# Patient Record
Sex: Female | Born: 1945 | Hispanic: Yes | Marital: Married | Smoking: Never smoker
Health system: Southern US, Community
[De-identification: ages and names within clinical notes are randomized; demographics above are authoritative.]

## PROBLEM LIST (undated history)

## (undated) DIAGNOSIS — I10 Essential (primary) hypertension: Secondary | ICD-10-CM

## (undated) DIAGNOSIS — H269 Unspecified cataract: Secondary | ICD-10-CM

## (undated) DIAGNOSIS — E785 Hyperlipidemia, unspecified: Secondary | ICD-10-CM

## (undated) HISTORY — DX: Hyperlipidemia, unspecified: E78.5

## (undated) HISTORY — DX: Essential (primary) hypertension: I10

## (undated) HISTORY — PX: EYE SURGERY: SHX253

## (undated) HISTORY — DX: Unspecified cataract: H26.9

---

## 2008-02-17 ENCOUNTER — Ambulatory Visit (HOSPITAL_COMMUNITY): Admission: RE | Admit: 2008-02-17 | Discharge: 2008-02-17 | Payer: Self-pay | Admitting: Family Medicine

## 2010-04-24 ENCOUNTER — Ambulatory Visit (HOSPITAL_COMMUNITY): Admission: RE | Admit: 2010-04-24 | Discharge: 2010-04-24 | Payer: Self-pay | Admitting: Family Medicine

## 2010-11-19 ENCOUNTER — Encounter: Payer: Self-pay | Admitting: Family Medicine

## 2010-11-20 ENCOUNTER — Encounter: Payer: Self-pay | Admitting: Family Medicine

## 2013-07-21 ENCOUNTER — Other Ambulatory Visit (HOSPITAL_COMMUNITY): Payer: Self-pay | Admitting: Nurse Practitioner

## 2013-07-21 DIAGNOSIS — Z139 Encounter for screening, unspecified: Secondary | ICD-10-CM

## 2013-07-28 ENCOUNTER — Ambulatory Visit (HOSPITAL_COMMUNITY)
Admission: RE | Admit: 2013-07-28 | Discharge: 2013-07-28 | Disposition: A | Discharge status: Home / Self Care | Payer: PRIVATE HEALTH INSURANCE | Source: Ambulatory Visit | Attending: Nurse Practitioner | Admitting: Nurse Practitioner

## 2013-07-28 DIAGNOSIS — Z139 Encounter for screening, unspecified: Secondary | ICD-10-CM

## 2013-08-03 ENCOUNTER — Other Ambulatory Visit: Payer: Self-pay | Admitting: Nurse Practitioner

## 2013-08-03 DIAGNOSIS — R928 Other abnormal and inconclusive findings on diagnostic imaging of breast: Secondary | ICD-10-CM

## 2013-09-23 ENCOUNTER — Ambulatory Visit (HOSPITAL_COMMUNITY)
Admission: RE | Admit: 2013-09-23 | Discharge: 2013-09-23 | Disposition: A | Discharge status: Home / Self Care | Payer: PRIVATE HEALTH INSURANCE | Source: Ambulatory Visit | Attending: Nurse Practitioner | Admitting: Nurse Practitioner

## 2013-09-23 ENCOUNTER — Other Ambulatory Visit: Payer: Self-pay | Admitting: Nurse Practitioner

## 2013-09-23 DIAGNOSIS — R928 Other abnormal and inconclusive findings on diagnostic imaging of breast: Secondary | ICD-10-CM | POA: Insufficient documentation

## 2013-09-23 DIAGNOSIS — N63 Unspecified lump in unspecified breast: Secondary | ICD-10-CM | POA: Insufficient documentation

## 2014-03-05 ENCOUNTER — Ambulatory Visit: Payer: Self-pay | Admitting: Family Medicine

## 2014-03-19 ENCOUNTER — Ambulatory Visit (INDEPENDENT_AMBULATORY_CARE_PROVIDER_SITE_OTHER): Payer: Self-pay | Admitting: Family Medicine

## 2014-03-19 ENCOUNTER — Encounter: Payer: Self-pay | Admitting: Family Medicine

## 2014-03-19 VITALS — BP 134/67 | HR 95 | Temp 98.1°F | Wt 187.1 lb

## 2014-03-19 DIAGNOSIS — I10 Essential (primary) hypertension: Secondary | ICD-10-CM | POA: Insufficient documentation

## 2014-03-19 DIAGNOSIS — K089 Disorder of teeth and supporting structures, unspecified: Secondary | ICD-10-CM

## 2014-03-19 DIAGNOSIS — H269 Unspecified cataract: Secondary | ICD-10-CM

## 2014-03-19 DIAGNOSIS — M549 Dorsalgia, unspecified: Secondary | ICD-10-CM

## 2014-03-19 DIAGNOSIS — E785 Hyperlipidemia, unspecified: Secondary | ICD-10-CM

## 2014-03-19 MED ORDER — LISINOPRIL 10 MG PO TABS: 10.0000 mg | ORAL_TABLET | Freq: Every day | ORAL | Status: AC

## 2014-03-19 MED ORDER — FENOFIBRATE 160 MG PO TABS: 160.0000 mg | ORAL_TABLET | Freq: Every day | ORAL | Status: AC

## 2014-03-19 NOTE — Assessment & Plan Note (Signed)
Well controlled on lisinopril 10mg  daily - refilled today - check bmet at next visit

## 2014-03-19 NOTE — Assessment & Plan Note (Addendum)
Patient has multiple missing teeth and those that remain appear infected, she also reports dental pain - Norma (SW) met with patient to offer options for low cost services to have her teeth examined/removed

## 2014-03-19 NOTE — Progress Notes (Signed)
   Subjective:    Patient ID: Marie Mccoy, female    DOB: 10/23/1946, 68 y.o.   MRN: [id number]  HPI Patient is a 68 year old woman with past history of hypertension and hyperlipidemia who presents to establish care. She also had bilateral cataracts which were removed in 2014 and she reports that now she sees very well. She denies any other medical or surgical history. She is a never smoker and denies alcohol and illicit drug use. She takes lisinopril and fenofibrate and no other medications.    Review of Systems  Constitutional: Negative for fever and unexpected weight change.  HENT: Positive for dental problem.   Respiratory: Negative for cough and shortness of breath.   Cardiovascular: Negative for chest pain and leg swelling.  Gastrointestinal: Negative for nausea, vomiting, abdominal pain and diarrhea.  Musculoskeletal: Positive for back pain.  Neurological: Negative for dizziness.  All other systems reviewed and are negative.      Objective:   Physical Exam  Nursing note and vitals reviewed. Constitutional: She is oriented to person, place, and time. She appears well-developed and well-nourished. No distress.  HENT:  Head: Normocephalic and atraumatic.  Very poor dentition with most teeth missing. Few remaining teeth are tender and appear necrotic  Eyes: Conjunctivae are normal. Right eye exhibits no discharge. Left eye exhibits no discharge. No scleral icterus.  Cardiovascular: Normal rate, regular rhythm, normal heart sounds and intact distal pulses.   No murmur heard. Pulmonary/Chest: Effort normal and breath sounds normal. No respiratory distress. She has no wheezes.  Abdominal: Soft. Bowel sounds are normal. She exhibits no distension and no mass. There is no tenderness. There is no rebound and no guarding.  Musculoskeletal: Normal range of motion. She exhibits no edema and no tenderness.  Neurological: She is alert and oriented to person, place, and time.  Skin:  Skin is warm and dry. No rash noted. She is not diaphoretic.  Psychiatric: She has a normal mood and affect. Her behavior is normal.          Assessment & Plan:

## 2014-03-19 NOTE — Patient Instructions (Signed)
Gracias por venir hoy.  Estoy feliz de tener Usted y su esposo como pacientes.  Si esta enferma, por favor, llama a este numero para hacer una cita conmigo o con uno de mis companeros.  [phone]

## 2014-03-19 NOTE — Assessment & Plan Note (Signed)
Reports previously had severe upper back pain and was given naproxen. It is now better.

## 2014-03-19 NOTE — Assessment & Plan Note (Signed)
Patient reports history of hyperlipidemia, well controlled on fenofibrate - check lipid panel at next visit to assess risk

## 2014-03-26 ENCOUNTER — Other Ambulatory Visit (HOSPITAL_COMMUNITY): Payer: Self-pay | Admitting: *Deleted

## 2014-03-26 DIAGNOSIS — R928 Other abnormal and inconclusive findings on diagnostic imaging of breast: Secondary | ICD-10-CM

## 2014-04-05 ENCOUNTER — Emergency Department (HOSPITAL_COMMUNITY)
Admission: EM | Admit: 2014-04-05 | Discharge: 2014-04-05 | Disposition: A | Discharge status: Home / Self Care | Payer: Self-pay

## 2014-04-05 ENCOUNTER — Encounter (HOSPITAL_COMMUNITY): Payer: Self-pay | Admitting: Emergency Medicine

## 2014-04-05 NOTE — ED Notes (Signed)
Pt is spanish speaking, reports was sent here from [facility] for a pelvic exam. Does not know why, denies having any problems or symptoms. Reports was just sent here for a pap smear.

## 2014-04-07 ENCOUNTER — Ambulatory Visit (HOSPITAL_COMMUNITY)
Admission: RE | Admit: 2014-04-07 | Discharge: 2014-04-07 | Disposition: A | Discharge status: Home / Self Care | Payer: PRIVATE HEALTH INSURANCE | Source: Ambulatory Visit | Attending: *Deleted | Admitting: *Deleted

## 2014-04-07 DIAGNOSIS — Z0389 Encounter for observation for other suspected diseases and conditions ruled out: Secondary | ICD-10-CM | POA: Insufficient documentation

## 2014-04-07 DIAGNOSIS — R928 Other abnormal and inconclusive findings on diagnostic imaging of breast: Secondary | ICD-10-CM

## 2015-02-28 ENCOUNTER — Ambulatory Visit (INDEPENDENT_AMBULATORY_CARE_PROVIDER_SITE_OTHER): Payer: Self-pay | Admitting: Cardiovascular Disease

## 2015-02-28 ENCOUNTER — Encounter: Payer: Self-pay | Admitting: Cardiovascular Disease

## 2015-02-28 VITALS — BP 140/76 | HR 71 | Ht 62.0 in | Wt 191.0 lb

## 2015-02-28 DIAGNOSIS — E785 Hyperlipidemia, unspecified: Secondary | ICD-10-CM

## 2015-02-28 DIAGNOSIS — I10 Essential (primary) hypertension: Secondary | ICD-10-CM

## 2015-02-28 DIAGNOSIS — R5383 Other fatigue: Secondary | ICD-10-CM

## 2015-02-28 DIAGNOSIS — R072 Precordial pain: Secondary | ICD-10-CM

## 2015-02-28 NOTE — Patient Instructions (Signed)
Your physician recommends that you schedule a follow-up appointment in: 1 month with Dr.Koneswaran    Your physician recommends that you continue on your current medications as directed. Please refer to the Current Medication list given to you today.    Your physician has requested that you have a lexiscan myoview. For further information please visit [email or web address]. Please follow instruction sheet, as given.     Thank you for choosing [facility] !         

## 2015-02-28 NOTE — Progress Notes (Signed)
Patient ID: Marie Mccoy, female   DOB: 02/04/1946, 69 y.o.   MRN: [id number]       CARDIOLOGY CONSULT NOTE  Patient ID: Marie Mccoy MRN: [name] DOB/AGE: 70/12/1945 70 y.o.  Admit date: (Not on file) Primary Physician [name], [name], [name]  Reason for Consultation: chest pain, abnormal ECG  HPI: The patient is a Spanish-speaking 69 year old woman with a past medical history significant for hypertension, hyperlipidemia, and obesity. [facility] and complained of chest pain in the retrosternal region after walking followed by fatigue. I reviewed all recent labs and studies which included a hemoglobin 15.1, platelets 212, sodium 141, potassium 4.4, BUN 12, creatinine 0.49, total cholesterol 167, HDL 39, triglycerides 274, LDL 73. She was supposed to have been started on lovastatin. An ECG was performed which demonstrated significant undulating artifact as well as sinus rhythm and a nonspecific ST segment and T-wave abnormality.  She is here with a Nurse, learning disabilitytranslator and her daughter. She says she has had chest pain for the past 2-3 years. It occurs in the retrosternal region and is aggravated by walking and alleviated with rest. She denies associated lightheadedness and dizziness. She feels fatigued afterwards. While she denies palpitations, her daughter says that she sees her mother's chest beating rapidly while the patient is asleep.  She also has a history of Bell's palsy approximately 2 years ago and had some right-sided paralysis at the age of 69 which resolved. She denies a history of stroke.    No Known Allergies  Current Outpatient Prescriptions  Medication Sig Dispense Refill  . fenofibrate 160 MG tablet Take 1 tablet (160 mg total) by mouth daily. 30 tablet 12  . lisinopril (PRINIVIL,ZESTRIL) 10 MG tablet Take 1 tablet (10 mg total) by mouth daily. 30 tablet 12  . naproxen (NAPROSYN) 500 MG tablet Take 500 mg by  mouth 2 (two) times daily as needed.    . Omega-3 Fatty Acids (FISH OIL) 1000 MG CAPS Take 1,000 mg by mouth 2 (two) times daily.     No current facility-administered medications for this visit.    Past Medical History  Diagnosis Date  . Hypertension   . Hyperlipidemia   . Cataract     Past Surgical History  Procedure Laterality Date  . Eye surgery      History   Social History  . Marital Status: Married    Spouse Name: N/A  . Number of Children: N/A  . Years of Education: N/A   Occupational History  . Not on file.   Social History Main Topics  . Smoking status: Never Smoker   . Smokeless tobacco: Never Used  . Alcohol Use: No  . Drug Use: No  . Sexual Activity: Yes    Birth Control/ Protection: Post-menopausal   Other Topics Concern  . Not on file   Social History Narrative     No family history of premature CAD in 1st degree relatives.  Prior to Admission medications   Medication Sig Start Date End Date Taking? Authorizing Provider  fenofibrate 160 MG tablet Take 1 tablet (160 mg total) by mouth daily. 03/19/14  Yes Abram SanderElena M Adamo, MD  lisinopril (PRINIVIL,ZESTRIL) 10 MG tablet Take 1 tablet (10 mg total) by mouth daily. 03/19/14  Yes Abram SanderElena M Adamo, MD  naproxen (NAPROSYN) 500 MG tablet Take 500 mg by mouth 2 (two) times daily as needed.   Yes Historical Provider, MD  Omega-3 Fatty Acids (FISH OIL) 1000  MG CAPS Take 1,000 mg by mouth 2 (two) times daily.   Yes Historical Provider, MD     Review of systems complete and found to be negative unless listed above in HPI     Physical exam Blood pressure 140/76, pulse 71, height  (1.575 m), weight 191 lb (86.637 kg), SpO2 96 %. General: NAD Neck: No JVD, no thyromegaly or thyroid nodule.  Lungs: Clear to auscultation bilaterally with normal respiratory effort. CV: Nondisplaced PMI. Regular rate and rhythm, normal S1/S2, no S3/S4, no murmur.  No peripheral edema.  No carotid bruit.  Normal pedal pulses.    Abdomen: Soft, nontender, obese, no distention.  Skin: Intact without lesions or rashes.  Neurologic: Alert and oriented x 3.  Psych: Normal affect. Extremities: No clubbing or cyanosis.  HEENT: Normal.   ECG: Most recent ECG reviewed.  Labs:  No results found for: WBC, HGB, HCT, MCV, PLT No results for input(s): NA, K, CL, CO2, BUN, CREATININE, CALCIUM, PROT, BILITOT, ALKPHOS, ALT, AST, GLUCOSE in the last 168 hours.  Invalid input(s): LABALBU No results found for: CKTOTAL, CKMB, CKMBINDEX, TROPONINI No results found for: CHOL No results found for: HDL No results found for: LDLCALC No results found for: TRIG No results found for: CHOLHDL No results found for: LDLDIRECT       Studies: No results found.  ASSESSMENT AND PLAN:  1. Chest pain and fatigue: Symptoms suspicious for angina and ischemic heart disease. ECG is nonspecific. I will obtain a Lexiscan Cardiolite to evaluate for ischemia. For now, continue current medical therapy. Will plan for medical therapy unless degree of ischemia is large.  2. Essential HTN: Borderline BP today on lisinopril 10 mg. Will monitor.  3. Hyperlipidemia: Lipid results noted above. Supposed to have been started on lovastatin, but not currently taking.   Dispo: f/u 1 month.   Signed: Prentice Docker, M.D., F.A.C.C.  02/28/2015, 2:25 PM

## 2015-07-19 ENCOUNTER — Other Ambulatory Visit (HOSPITAL_COMMUNITY): Payer: Self-pay | Admitting: *Deleted

## 2015-07-19 DIAGNOSIS — Z09 Encounter for follow-up examination after completed treatment for conditions other than malignant neoplasm: Secondary | ICD-10-CM

## 2015-07-19 DIAGNOSIS — N611 Abscess of the breast and nipple: Secondary | ICD-10-CM

## 2015-08-01 ENCOUNTER — Ambulatory Visit (HOSPITAL_COMMUNITY)
Admission: RE | Admit: 2015-08-01 | Discharge: 2015-08-01 | Disposition: A | Discharge status: Home / Self Care | Payer: PRIVATE HEALTH INSURANCE | Source: Ambulatory Visit | Attending: *Deleted | Admitting: *Deleted

## 2015-08-01 DIAGNOSIS — Z1231 Encounter for screening mammogram for malignant neoplasm of breast: Secondary | ICD-10-CM | POA: Diagnosis present

## 2015-08-01 DIAGNOSIS — Z09 Encounter for follow-up examination after completed treatment for conditions other than malignant neoplasm: Secondary | ICD-10-CM

## 2015-08-01 DIAGNOSIS — N611 Abscess of the breast and nipple: Secondary | ICD-10-CM

## 2015-09-12 ENCOUNTER — Emergency Department (HOSPITAL_COMMUNITY): Payer: No Typology Code available for payment source

## 2015-09-12 ENCOUNTER — Emergency Department (HOSPITAL_COMMUNITY)
Admission: EM | Admit: 2015-09-12 | Discharge: 2015-09-12 | Disposition: A | Discharge status: Home / Self Care | Payer: No Typology Code available for payment source | Attending: Emergency Medicine | Admitting: Emergency Medicine

## 2015-09-12 ENCOUNTER — Encounter (HOSPITAL_COMMUNITY): Payer: Self-pay | Admitting: *Deleted

## 2015-09-12 DIAGNOSIS — Y92410 Unspecified street and highway as the place of occurrence of the external cause: Secondary | ICD-10-CM | POA: Insufficient documentation

## 2015-09-12 DIAGNOSIS — S3992XA Unspecified injury of lower back, initial encounter: Secondary | ICD-10-CM | POA: Insufficient documentation

## 2015-09-12 DIAGNOSIS — I10 Essential (primary) hypertension: Secondary | ICD-10-CM | POA: Diagnosis not present

## 2015-09-12 DIAGNOSIS — Y998 Other external cause status: Secondary | ICD-10-CM | POA: Insufficient documentation

## 2015-09-12 DIAGNOSIS — M25562 Pain in left knee: Secondary | ICD-10-CM

## 2015-09-12 DIAGNOSIS — Z8669 Personal history of other diseases of the nervous system and sense organs: Secondary | ICD-10-CM | POA: Diagnosis not present

## 2015-09-12 DIAGNOSIS — S8992XA Unspecified injury of left lower leg, initial encounter: Secondary | ICD-10-CM | POA: Insufficient documentation

## 2015-09-12 DIAGNOSIS — Y9389 Activity, other specified: Secondary | ICD-10-CM | POA: Insufficient documentation

## 2015-09-12 DIAGNOSIS — Z79899 Other long term (current) drug therapy: Secondary | ICD-10-CM | POA: Diagnosis not present

## 2015-09-12 DIAGNOSIS — M545 Low back pain: Secondary | ICD-10-CM

## 2015-09-12 DIAGNOSIS — Z8639 Personal history of other endocrine, nutritional and metabolic disease: Secondary | ICD-10-CM | POA: Insufficient documentation

## 2015-09-12 MED ORDER — MELOXICAM 15 MG PO TABS: 15.0000 mg | ORAL_TABLET | Freq: Every day | ORAL | Status: AC

## 2015-09-12 MED ORDER — METHOCARBAMOL 500 MG PO TABS: 500.0000 mg | ORAL_TABLET | Freq: Two times a day (BID) | ORAL | Status: AC
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
## 2015-09-12 NOTE — ED Notes (Addendum)
Patient reports left knee pain x 1 month. Patient speaks little english. Reports having right knee pain and left hip pain a few months ago, went to doctor, but did not take the follow up x-rays. States pain lessened but now presents with left knee pain.

## 2015-09-12 NOTE — Discharge Instructions (Signed)
Dolor de espalda en adultos °(Back Pain, Adult) °El dolor de espalda es muy frecuente en los adultos. La causa del dolor de espalda es rara vez peligrosa y el dolor a menudo mejora con el tiempo. Es posible que se desconozca la causa de esta afección. Algunas causas comunes son las siguientes: °· Distensión de los músculos o ligamentos que sostienen la columna vertebral. °· Desgaste (degeneración) de los discos vertebrales. °· Artritis. °· Lesiones directas en la espalda. °En muchas personas, el dolor de espalda es recurrente. Como rara vez es peligroso, las personas pueden aprender a manejar esta afección por sí mismas. °INSTRUCCIONES PARA EL CUIDADO EN EL HOGAR °Controle su dolor de espalda a fin de detectar algún cambio. Las siguientes indicaciones ayudarán a aliviar cualquier molestia que pueda sentir: °· Permanezca activo. Si permanece sentado o de pie en un mismo lugar durante mucho tiempo, se tensiona la espalda. No se siente, conduzca o permanezca de pie en un mismo lugar durante más de 30 minutos seguidos. Realice caminatas cortas en superficies planas tan pronto como le sea posible. Trate de caminar un poco más de tiempo cada día. °· Haga ejercicio regularmente como se lo haya indicado el médico. El ejercicio ayuda a que su espalda se cure más rápidamente. También ayuda a prevenir futuras lesiones al mantener los músculos fuertes y flexibles. °· No permanezca en la cama. Si hace reposo más de 1 a 2 días, puede demorar su recuperación. °· Preste atención a su cuerpo al inclinarse y levantarse. Las posiciones más cómodas son las que ejercen menos tensión en la espalda en recuperación. Siempre use técnicas apropiadas para levantar objetos, como por ejemplo: °· Flexionar las rodillas. °· Mantener la carga cerca del cuerpo. °· No torcerse. °· Encuentre una posición cómoda para dormir. Use un colchón firme y recuéstese de costado con las rodillas ligeramente flexionadas. Si se recuesta sobre la espalda, coloque  una almohada debajo de las rodillas. °· Evite sentir ansiedad o estrés. El estrés aumenta la tensión muscular y puede empeorar el dolor de espalda. Es importante reconocer si se siente ansioso o estresado y aprender maneras de controlarlo, por ejemplo haciendo ejercicio. °· Tome los medicamentos solamente como se lo haya indicado el médico. Los medicamentos de venta libre para aliviar el dolor y la inflamación a menudo son los más eficaces. El médico puede recetarle relajantes musculares. Estos medicamentos ayudan a calmar el dolor de modo que pueda reanudar más rápidamente sus actividades normales y el ejercicio saludable. °· Aplique hielo sobre la zona lesionada. °· Ponga el hielo en una bolsa plástica. °· Coloque una toalla entre la piel y la bolsa de hielo. °· Deje el hielo durante 20 minutos, 2 a 3 veces por día, durante los primeros 2 o 3 días. Después de eso, puede alternar el hielo y el calor para reducir el dolor y los espasmos. °· Mantenga un peso saludable. El exceso de peso ejerce presión adicional sobre la espalda y hace que resulte difícil mantener una buena postura. °SOLICITE ATENCIÓN MÉDICA SI: °· Siente un dolor que no se alivia con reposo o medicamentos. °· Siente mucho dolor que se extiende a las piernas o los glúteos. °· El dolor no mejora en una semana. °· Siente dolor por la noche. °· Pierde peso. °· Siente escalofríos o fiebre. °SOLICITE ATENCIÓN MÉDICA DE INMEDIATO SI:  °· Tiene nuevos problemas para controlar la vejiga o los intestinos. °· Siente debilidad o adormecimiento inusuales en los brazos o en las piernas. °· Siente náuseas o vómitos. °· Siente dolor abdominal. °· Siente que va a desmayarse. °  °  Esta informacin no tiene Theme park managercomo fin reemplazar el consejo del mdico. Asegrese de hacerle al mdico cualquier pregunta que tenga.   Document Released: 10/15/2005 Document Revised: 11/05/2014 Elsevier Interactive Patient Education 2016 Elsevier Inc. Derrame en la rodilla (Knee  Effusion) Un derrame en la rodilla significa que hay exceso de lquido en la articulacin de la rodilla. Esto puede causar dolor e hinchazn en la zona. Es posible que esto dificulte la flexin y el movimiento de la rodilla debido a que aumentan el dolor y la presin en la articulacin. Si hay lquido en la rodilla, a menudo significa que hay algn problema, por ejemplo, artritis grave, inflamacin que no es normal o una infeccin. Otra causa comn de un derrame en la rodilla es una lesin en los msculos, los ligamentos o el cartlago de la rodilla. INSTRUCCIONES PARA EL CUIDADO EN EL HOGAR  Use las muletas como se lo haya indicado el mdico.  Use el dispositivo ortopdico para la rodilla como se lo haya indicado el mdico.  Aplique hielo sobre la zona hinchada:  Ponga el hielo en una bolsa plstica.  Coloque una toalla entre la piel y la bolsa de hielo.  Coloque el hielo durante 20minutos, 2 a 3veces por Futures traderda.  Mantenga la rodilla en alto (elevada) cuando est sentado o acostado.  Tome los medicamentos solamente como se lo haya indicado el mdico.  Haga ejercicios de rehabilitacin o elongacin como se lo haya indicado el mdico.  Mantenga la rodilla en reposo como se lo haya indicado el mdico. Puede retomar sus actividades habituales cuando el mdico lo autorice.   Concurra a todas las visitas de control como se lo haya indicado el mdico. Esto es importante. SOLICITE ATENCIN MDICA SI:  Tiene dolor continuo (persistente) en la rodilla. SOLICITE ATENCIN MDICA DE INMEDIATO SI:  Aumenta la hinchazn o el enrojecimiento en la rodilla.  Siente un dolor intenso en la rodilla.  Tiene fiebre.   Esta informacin no tiene Theme park managercomo fin reemplazar el consejo del mdico. Asegrese de hacerle al mdico cualquier pregunta que tenga.   Document Released: 01/22/2008 Document Revised: 11/05/2014 Elsevier Interactive Patient Education [employer]2016 Elsevier Inc.

## 2016-01-18 DIAGNOSIS — Z139 Encounter for screening, unspecified: Secondary | ICD-10-CM

## 2016-06-22 NOTE — Congregational Nurse Program (Signed)
Congregational Nurse Program Note  Date of Encounter: 01/18/2016  Past Medical History: Past Medical History:  Diagnosis Date  . Cataract   . Hyperlipidemia   . Hypertension     Encounter Details:     CNP Questionnaire - 06/22/16 1924      Patient Demographics   Is this a new or existing patient? New   Patient is considered a/an Not Applicable   Race Latino/Hispanic     Patient Assistance   [place] of [place]   Patient's financial/insurance status Self-Pay   Uninsured Patient Yes   Interventions Assisted patient in making appt.   Patient referred to apply for the following financial assistance Not Applicable   Food insecurities addressed Not Applicable   Transportation assistance No   Assistance securing medications No   Educational health offerings Chronic disease     Encounter Details   Primary purpose of visit Chronic Illness/Condition Visit   Was an Emergency Department visit averted? No   Does patient have a medical provider? Yes   Patient referred to Follow up with established PCP   Was a mental health screening completed? (GAINS tool) No   Does patient have dental issues? No   Does patient have vision issues? No   Does your patient have an abnormal blood pressure today? No   Since previous encounter, have you referred patient for abnormal blood pressure that resulted in a new diagnosis or medication change? No   Does your patient have an abnormal blood glucose today? No   Since previous encounter, have you referred patient for abnormal blood glucose that resulted in a new diagnosis or medication change? No   Was there a life-saving intervention made? No      Client was assisted with follow-up appointment for [facility] 04/06/16 8:30. Pearletha AlfredJan Aldous Housel,RN CNP [phone]

## 2019-03-17 ENCOUNTER — Emergency Department (HOSPITAL_COMMUNITY)
Admission: EM | Admit: 2019-03-17 | Discharge: 2019-03-17 | Disposition: A | Discharge status: Home / Self Care | Payer: No Typology Code available for payment source | Attending: Emergency Medicine | Admitting: Emergency Medicine

## 2019-03-17 ENCOUNTER — Emergency Department (HOSPITAL_COMMUNITY): Payer: No Typology Code available for payment source

## 2019-03-17 ENCOUNTER — Other Ambulatory Visit: Payer: Self-pay

## 2019-03-17 ENCOUNTER — Encounter (HOSPITAL_COMMUNITY): Payer: Self-pay

## 2019-03-17 DIAGNOSIS — S299XXA Unspecified injury of thorax, initial encounter: Secondary | ICD-10-CM | POA: Diagnosis present

## 2019-03-17 DIAGNOSIS — S2221XA Fracture of manubrium, initial encounter for closed fracture: Secondary | ICD-10-CM | POA: Diagnosis not present

## 2019-03-17 DIAGNOSIS — I10 Essential (primary) hypertension: Secondary | ICD-10-CM | POA: Insufficient documentation

## 2019-03-17 DIAGNOSIS — Z79899 Other long term (current) drug therapy: Secondary | ICD-10-CM | POA: Diagnosis not present

## 2019-03-17 DIAGNOSIS — Y9389 Activity, other specified: Secondary | ICD-10-CM | POA: Diagnosis not present

## 2019-03-17 DIAGNOSIS — Y92410 Unspecified street and highway as the place of occurrence of the external cause: Secondary | ICD-10-CM | POA: Diagnosis not present

## 2019-03-17 DIAGNOSIS — Y999 Unspecified external cause status: Secondary | ICD-10-CM | POA: Insufficient documentation

## 2019-03-17 DIAGNOSIS — S301XXA Contusion of abdominal wall, initial encounter: Secondary | ICD-10-CM | POA: Insufficient documentation

## 2019-03-17 LAB — CBC WITH DIFFERENTIAL/PLATELET
Abs Immature Granulocytes: 0.14 10*3/uL — ABNORMAL HIGH (ref 0.00–0.07)
Basophils Absolute: 0 10*3/uL (ref 0.0–0.1)
Basophils Relative: 0 %
Eosinophils Absolute: 0 10*3/uL (ref 0.0–0.5)
Eosinophils Relative: 0 %
HCT: 46.9 % — ABNORMAL HIGH (ref 36.0–46.0)
Hemoglobin: 16.1 g/dL — ABNORMAL HIGH (ref 12.0–15.0)
Immature Granulocytes: 1 %
Lymphocytes Relative: 18 %
Lymphs Abs: 2.3 10*3/uL (ref 0.7–4.0)
MCH: 33.3 pg (ref 26.0–34.0)
MCHC: 34.3 g/dL (ref 30.0–36.0)
MCV: 97.1 fL (ref 80.0–100.0)
Monocytes Absolute: 0.7 10*3/uL (ref 0.1–1.0)
Monocytes Relative: 5 %
Neutro Abs: 9.7 10*3/uL — ABNORMAL HIGH (ref 1.7–7.7)
Neutrophils Relative %: 76 %
Platelets: 217 10*3/uL (ref 150–400)
RBC: 4.83 MIL/uL (ref 3.87–5.11)
RDW: 12.3 % (ref 11.5–15.5)
WBC: 12.9 10*3/uL — ABNORMAL HIGH (ref 4.0–10.5)
nRBC: 0 % (ref 0.0–0.2)

## 2019-03-17 LAB — COMPREHENSIVE METABOLIC PANEL
ALT: 26 U/L (ref 0–44)
AST: 29 U/L (ref 15–41)
Albumin: 4.2 g/dL (ref 3.5–5.0)
Alkaline Phosphatase: 76 U/L (ref 38–126)
Anion gap: 14 (ref 5–15)
BUN: 10 mg/dL (ref 8–23)
CO2: 22 mmol/L (ref 22–32)
Calcium: 9.8 mg/dL (ref 8.9–10.3)
Chloride: 101 mmol/L (ref 98–111)
Creatinine, Ser: 0.55 mg/dL (ref 0.44–1.00)
GFR calc Af Amer: >60 mL/min (ref >60)
GFR calc non Af Amer: >60 mL/min (ref >60)
Glucose, Bld: 153 mg/dL — ABNORMAL HIGH (ref 70–99)
Potassium: 4.1 mmol/L (ref 3.5–5.1)
Sodium: 137 mmol/L (ref 135–145)
Total Bilirubin: 0.7 mg/dL (ref 0.3–1.2)
Total Protein: 8 g/dL (ref 6.5–8.1)

## 2019-03-17 LAB — TYPE AND SCREEN
ABO/RH(D): O POS
Antibody Screen: NEGATIVE

## 2019-03-17 LAB — PROTIME-INR
INR: 1 (ref 0.8–1.2)
Prothrombin Time: 12.8 seconds (ref 11.4–15.2)

## 2019-03-17 LAB — LIPASE, BLOOD: Lipase: 20 U/L (ref 11–51)

## 2019-03-17 MED ORDER — METHOCARBAMOL 500 MG PO TABS: 500.0000 mg | ORAL_TABLET | Freq: Three times a day (TID) | ORAL | 0 refills | Status: AC | PRN

## 2019-03-17 MED ORDER — NAPROXEN 375 MG PO TABS: 375.0000 mg | ORAL_TABLET | Freq: Two times a day (BID) | ORAL | 0 refills | Status: AC

## 2019-03-17 MED ORDER — MORPHINE SULFATE (PF) 4 MG/ML IV SOLN
4.0000 mg | Freq: Once | INTRAVENOUS | Status: AC
  Administered 2019-03-17: 4 mg via INTRAVENOUS
  Filled 2019-03-17: qty 1

## 2019-03-17 MED ORDER — HYDROCODONE-ACETAMINOPHEN 5-325 MG PO TABS: 1.0000 | ORAL_TABLET | Freq: Four times a day (QID) | ORAL | 0 refills | Status: AC | PRN

## 2019-03-17 MED ORDER — KETOROLAC TROMETHAMINE 30 MG/ML IJ SOLN: 30.0000 mg | Freq: Once | INTRAMUSCULAR | Status: DC

## 2019-03-17 MED ORDER — SODIUM CHLORIDE 0.9 % IV SOLN
Freq: Once | INTRAVENOUS | Status: AC
  Administered 2019-03-17: 21:00:00 via INTRAVENOUS

## 2019-03-17 MED ORDER — IOHEXOL 350 MG/ML SOLN
100.0000 mL | Freq: Once | INTRAVENOUS | Status: AC | PRN
  Administered 2019-03-17: 22:00:00 100 mL via INTRAVENOUS

## 2019-03-17 NOTE — ED Notes (Signed)
Nurse navigator spoke with granddaughter [phone] and spoke with patient in waiting room with granddaughter. Updated plan of care.
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
## 2019-03-17 NOTE — ED Notes (Signed)
Patient transported to CT 

## 2019-03-17 NOTE — ED Notes (Addendum)
Attempted to call patient's granddaughter with updates but no answer. Voicemail not available with given number.
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
## 2019-03-17 NOTE — ED Notes (Signed)
Patient verbalizes understanding of discharge instructions. Opportunity for questioning and answers were provided. Armband removed by staff, pt discharged from ED in wheelchair.  
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
## 2019-03-18 LAB — ABO/RH: ABO/RH(D): O POS
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
